# Patient Record
Sex: Female | Born: 1970 | Race: White | Hispanic: No | Smoking: Never smoker
Health system: Southern US, Community
[De-identification: ages and names within clinical notes are randomized; demographics above are authoritative.]

## PROBLEM LIST (undated history)

## (undated) DIAGNOSIS — J45909 Unspecified asthma, uncomplicated: Secondary | ICD-10-CM

---

## 1999-10-15 ENCOUNTER — Encounter: Payer: Self-pay | Admitting: Obstetrics and Gynecology

## 1999-10-15 ENCOUNTER — Ambulatory Visit (HOSPITAL_COMMUNITY): Admission: RE | Admit: 1999-10-15 | Discharge: 1999-10-15 | Payer: Self-pay | Admitting: Obstetrics and Gynecology

## 1999-11-05 ENCOUNTER — Ambulatory Visit (HOSPITAL_COMMUNITY): Admission: RE | Admit: 1999-11-05 | Discharge: 1999-11-05 | Payer: Self-pay | Admitting: Obstetrics and Gynecology

## 1999-11-05 ENCOUNTER — Encounter: Payer: Self-pay | Admitting: Obstetrics and Gynecology

## 1999-12-24 ENCOUNTER — Ambulatory Visit (HOSPITAL_COMMUNITY): Admission: RE | Admit: 1999-12-24 | Discharge: 1999-12-24 | Payer: Self-pay | Admitting: Obstetrics and Gynecology

## 2000-03-10 ENCOUNTER — Inpatient Hospital Stay (HOSPITAL_COMMUNITY): Admission: AD | Admit: 2000-03-10 | Discharge: 2000-03-12 | Payer: Self-pay | Admitting: Obstetrics and Gynecology

## 2000-05-26 ENCOUNTER — Other Ambulatory Visit: Admission: RE | Admit: 2000-05-26 | Discharge: 2000-05-26 | Payer: Self-pay | Admitting: Obstetrics and Gynecology

## 2002-05-29 ENCOUNTER — Other Ambulatory Visit: Admission: RE | Admit: 2002-05-29 | Discharge: 2002-05-29 | Payer: Self-pay | Admitting: Obstetrics and Gynecology

## 2003-07-10 ENCOUNTER — Other Ambulatory Visit: Admission: RE | Admit: 2003-07-10 | Discharge: 2003-07-10 | Payer: Self-pay | Admitting: Obstetrics and Gynecology

## 2004-07-29 ENCOUNTER — Other Ambulatory Visit: Admission: RE | Admit: 2004-07-29 | Discharge: 2004-07-29 | Payer: Self-pay | Admitting: Obstetrics and Gynecology

## 2005-08-31 ENCOUNTER — Other Ambulatory Visit: Admission: RE | Admit: 2005-08-31 | Discharge: 2005-08-31 | Payer: Self-pay | Admitting: Obstetrics and Gynecology

## 2006-03-16 ENCOUNTER — Encounter: Admission: RE | Admit: 2006-03-16 | Discharge: 2006-03-16 | Payer: Self-pay | Admitting: Obstetrics and Gynecology

## 2006-03-30 ENCOUNTER — Encounter: Admission: RE | Admit: 2006-03-30 | Discharge: 2006-03-30 | Payer: Self-pay | Admitting: Obstetrics and Gynecology

## 2006-10-21 ENCOUNTER — Encounter: Admission: RE | Admit: 2006-10-21 | Discharge: 2006-10-21 | Payer: Self-pay | Admitting: Unknown Physician Specialty

## 2010-03-27 ENCOUNTER — Encounter: Admission: RE | Admit: 2010-03-27 | Discharge: 2010-03-27 | Payer: Self-pay | Admitting: Obstetrics and Gynecology

## 2010-09-11 NOTE — Discharge Summary (Signed)
[facility]  Patient:    Sarah Patrick, Sarah Patrick Visit Number: [id number] MRN: [id number]          Service Type: OBS Location: 910A [id number] [id number] Attending Physician:  Oliver Pila Admit Date:  03/10/2000 Disc. Date: 03/12/00                             Discharge Summary  HISTORY OF PRESENT ILLNESS:   A 40 year old white female P0-1-0-1, gravida 2, with EDC 03/17/00 by first trimester ultrasound inconsistent with last period, presented for induction of labor given size greater than dates and suspected large-for-gestational-age fetus and favorable cervix. Prenatal care was uncomplicated except Rh negative, got RhoGAM at 28 weeks. Prenatal labs: A negative with negative antibody, RPR nonreactive, rubella immune, hepatitis B surface antigen negative, HIV negative, GC and Chlamydia negative, Group B strep negative.  PAST OBSTETRIC HISTORY:       The patient had a 7 pound 7 ounce infant in 1998 vaginally.  PAST GYNECOLOGIC HISTORY:     No abnormal Paps.  PAST MEDICAL HISTORY:         Negative.  SURGICAL HISTORY:             1990, broken left leg.  ALLERGIES:                    No known allergies.  MEDICATIONS:                  Prenatal vitamins.  PHYSICAL EXAMINATION:         Physical exam on admission revealed afebrile patient with normal vital signs, reactive nonstress test. Heart normal size and sounds. Lungs clear. Abdomen gravid. Estimated fetal weight 8 to 8.5 pounds. Cervix 3 cm, 70%, vertex, -3.  HOSPITAL COURSE:              The patient was placed on Pitocin and it went up to 28 milliunits a minute with no contractions. The bag was changed. The patient began having contractions. Cervix reached 4 cm at 5:30 p.m. Dr. Senaida Ores attempted to "needle" the membranes with a few drops of fluid and mucus obtained. Because the station was high a full rupture of membranes was not done. By 8 p.m. the cervix was 9 cm and the patient had an  epidural. She progressed to complete and pushed well with a normal spontaneous vaginal delivery of a vigorous female infant over a first-degree perineal laceration, Apgars of 7 and 9 at one and five minutes. Infant weighed 8 pounds 5 ounces. Nuchal cord x 1 was reduced over the head. There was terminal meconium. Placenta delivered spontaneously. Three-vessel cord. Blood loss about 400 cc. First-degree perineal laceration repaired with 2-0 Vicryl for hemostasis. Cervix and rectum intact. Mild shoulder dystocia relieved by suprapubic pressure and McRoberts maneuver without difficulty. Postpartum the patient did quite well and was discharged on the second postpartum day. The baby was Rh negative. The patient was not a candidate for RhoGAM. Hemoglobin was 12.6, hematocrit 34.3, white count 10,100, platelet count 175,000.  FINAL DIAGNOSIS:              Intrauterine pregnancy at 39 weeks, delivered vertex vaginally.  OPERATION:                    Spontaneous vaginal delivery, repair of first-degree perineal laceration.  FINAL CONDITION:  Improved.  DISCHARGE INSTRUCTIONS:       Instructions include our regular discharge instruction booklet. Patient is given a prescription for ibuprofen 600 mg, 20 tablets, 1 every 8 hours as needed for pain, with no refills. She is to return in six weeks for follow-up examination. Attending Physician:  Oliver Pila DD:  03/12/00 TD:  03/12/00 Job: [id number]

## 2011-03-03 ENCOUNTER — Other Ambulatory Visit: Payer: Self-pay | Admitting: Obstetrics and Gynecology

## 2011-03-03 DIAGNOSIS — Z1231 Encounter for screening mammogram for malignant neoplasm of breast: Secondary | ICD-10-CM

## 2011-04-01 ENCOUNTER — Ambulatory Visit: Payer: Self-pay

## 2011-04-13 ENCOUNTER — Ambulatory Visit
Admission: RE | Admit: 2011-04-13 | Discharge: 2011-04-13 | Disposition: A | Discharge status: Home / Self Care | Payer: BC Managed Care – PPO | Source: Ambulatory Visit | Attending: Obstetrics and Gynecology | Admitting: Obstetrics and Gynecology

## 2011-04-13 DIAGNOSIS — Z1231 Encounter for screening mammogram for malignant neoplasm of breast: Secondary | ICD-10-CM

## 2012-03-08 ENCOUNTER — Other Ambulatory Visit: Payer: Self-pay | Admitting: Obstetrics & Gynecology

## 2012-03-08 DIAGNOSIS — Z1231 Encounter for screening mammogram for malignant neoplasm of breast: Secondary | ICD-10-CM

## 2012-04-25 ENCOUNTER — Ambulatory Visit
Admission: RE | Admit: 2012-04-25 | Discharge: 2012-04-25 | Disposition: A | Discharge status: Home / Self Care | Payer: BC Managed Care – PPO | Source: Ambulatory Visit | Attending: Obstetrics & Gynecology | Admitting: Obstetrics & Gynecology

## 2012-04-25 DIAGNOSIS — Z1231 Encounter for screening mammogram for malignant neoplasm of breast: Secondary | ICD-10-CM

## 2012-05-02 ENCOUNTER — Other Ambulatory Visit: Payer: Self-pay | Admitting: Obstetrics & Gynecology

## 2012-05-02 DIAGNOSIS — R928 Other abnormal and inconclusive findings on diagnostic imaging of breast: Secondary | ICD-10-CM

## 2012-05-09 ENCOUNTER — Ambulatory Visit
Admission: RE | Admit: 2012-05-09 | Discharge: 2012-05-09 | Disposition: A | Discharge status: Home / Self Care | Payer: BC Managed Care – PPO | Source: Ambulatory Visit | Attending: Obstetrics & Gynecology | Admitting: Obstetrics & Gynecology

## 2012-05-09 DIAGNOSIS — R928 Other abnormal and inconclusive findings on diagnostic imaging of breast: Secondary | ICD-10-CM

## 2012-05-09 IMAGING — MG MM DIGITAL DIAGNOSTIC LIMITED*L*
3 series · 3 of 3 positions shown · non-contrast
Comparison: Previous examinations, including the screening
mammogram dated 04/25/2012.

CLINICAL DATA: Possible left breast mass at recent screening
mammography.

DIGITAL DIAGNOSTIC LEFT MAMMOGRAM  AND LEFT BREAST ULTRASOUND:

[L CC]
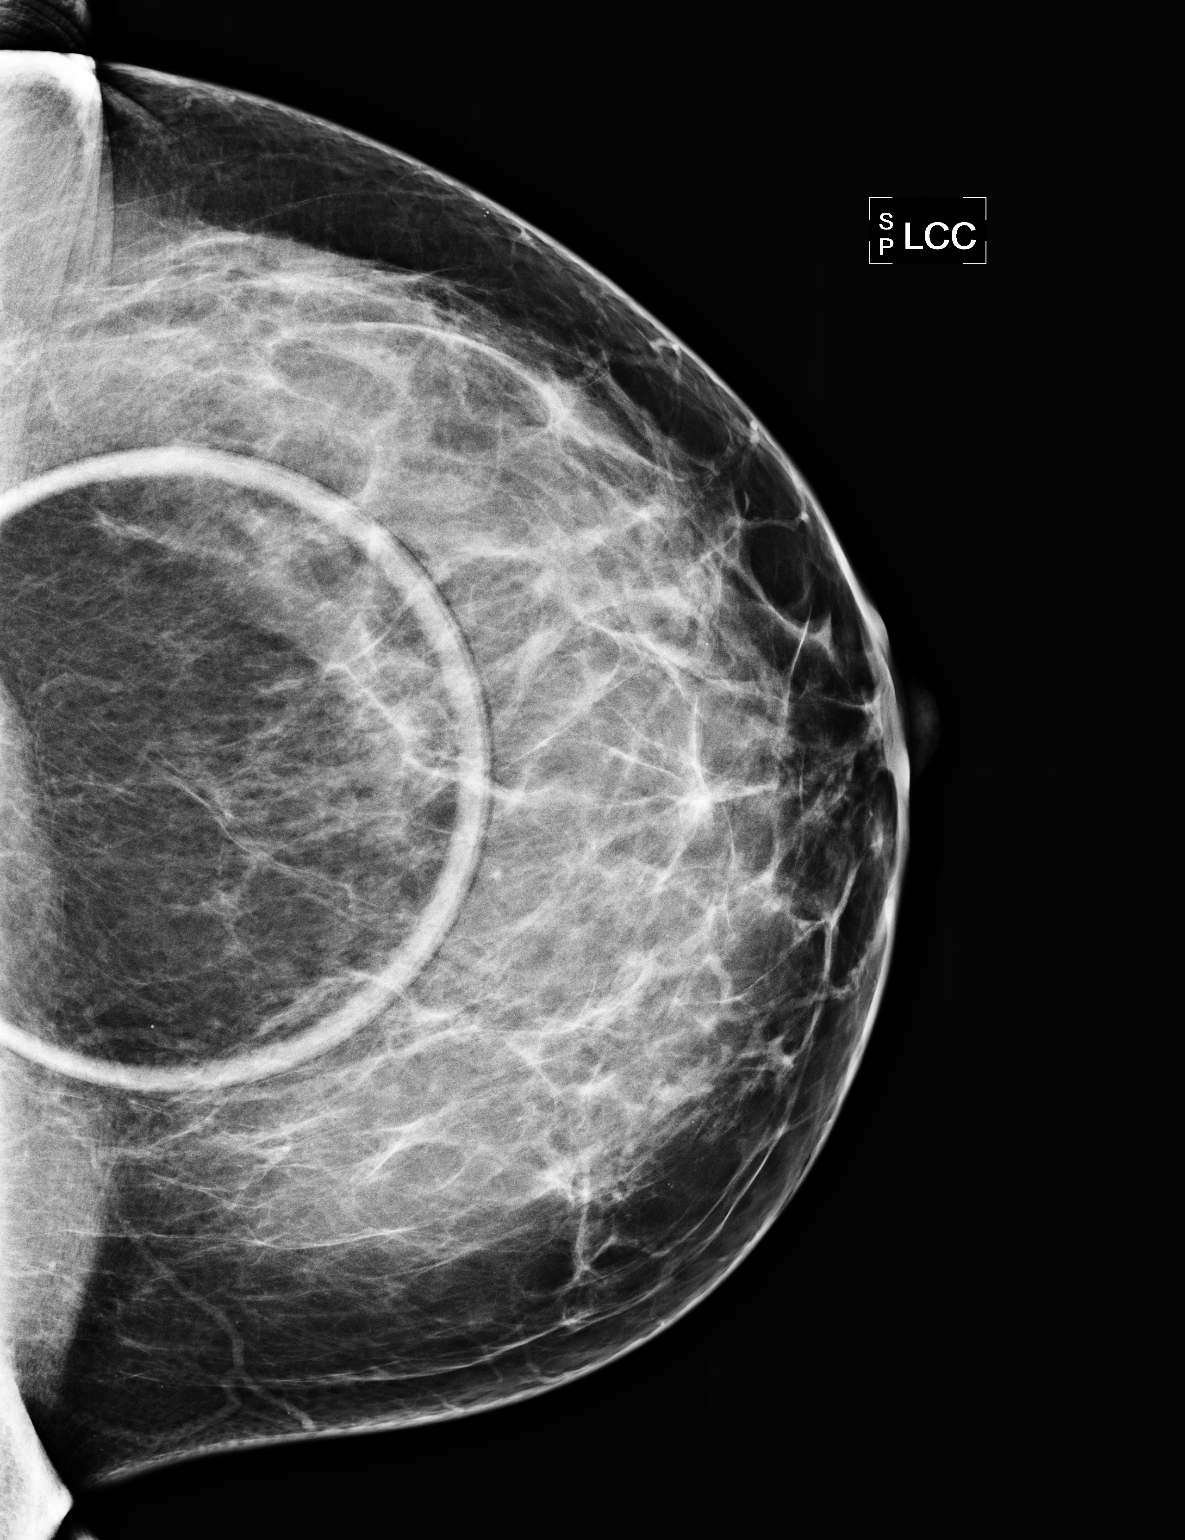

[L MLO (1 of 2)]
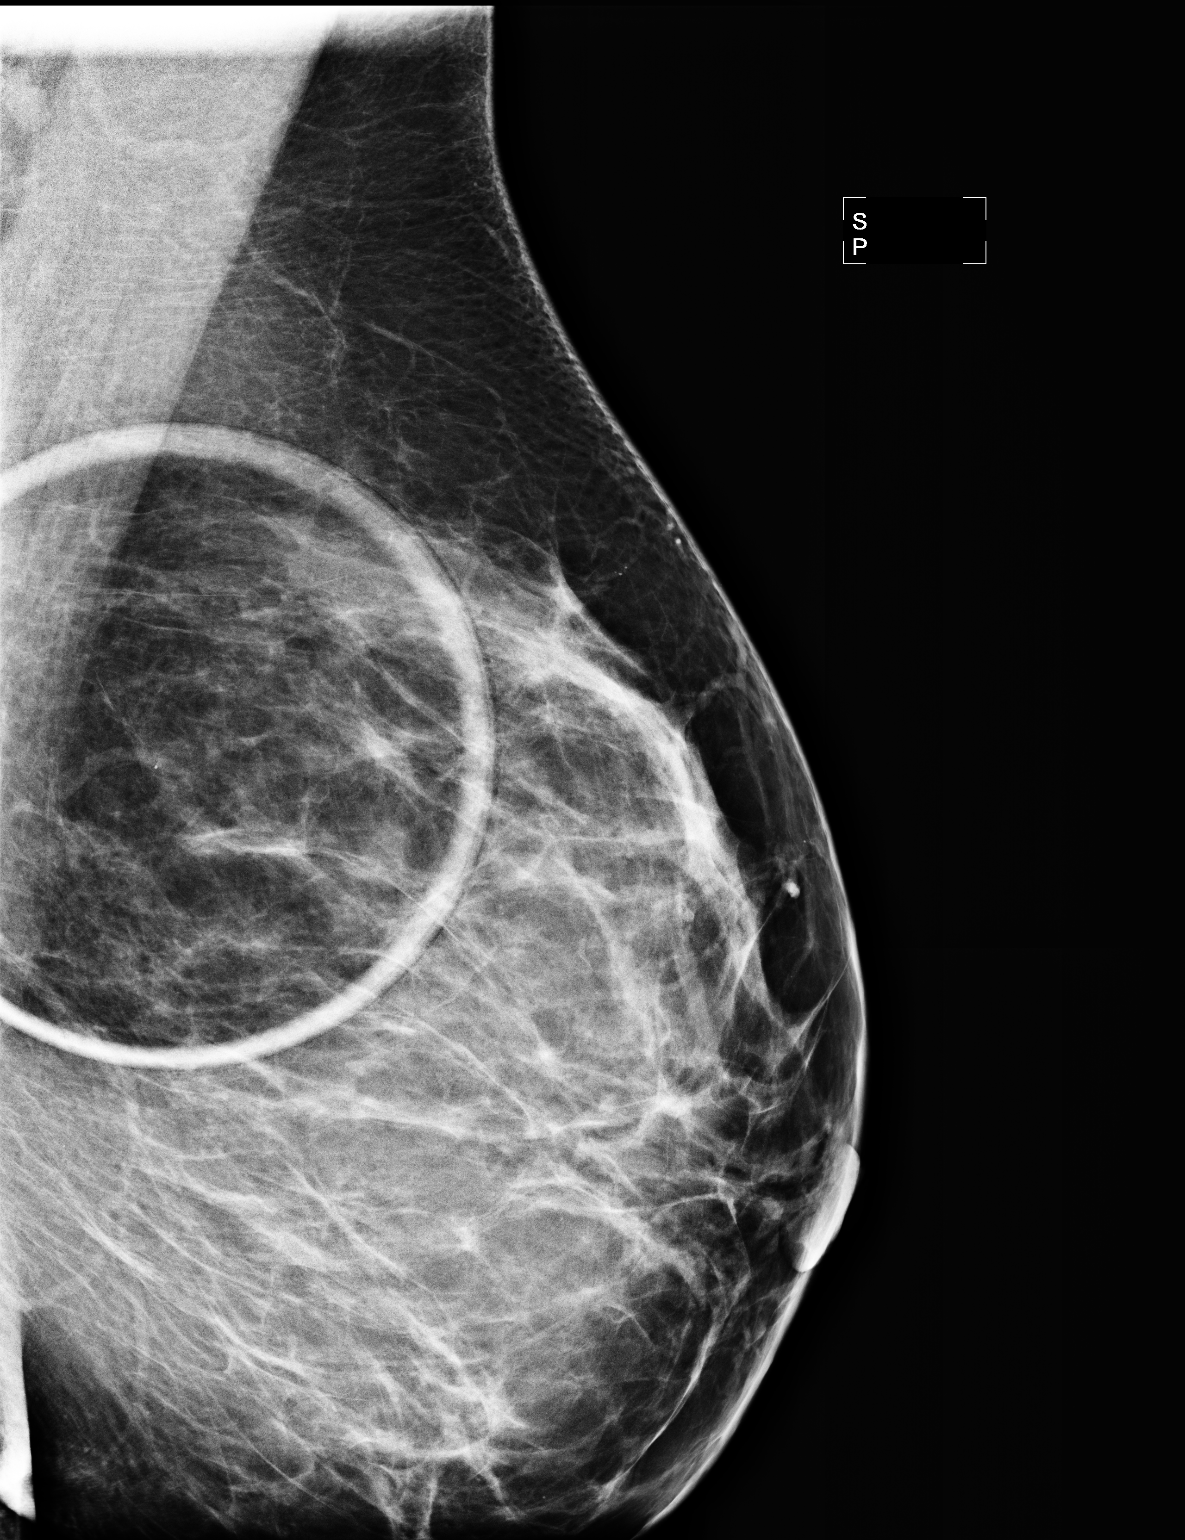

[L MLO (2 of 2)]
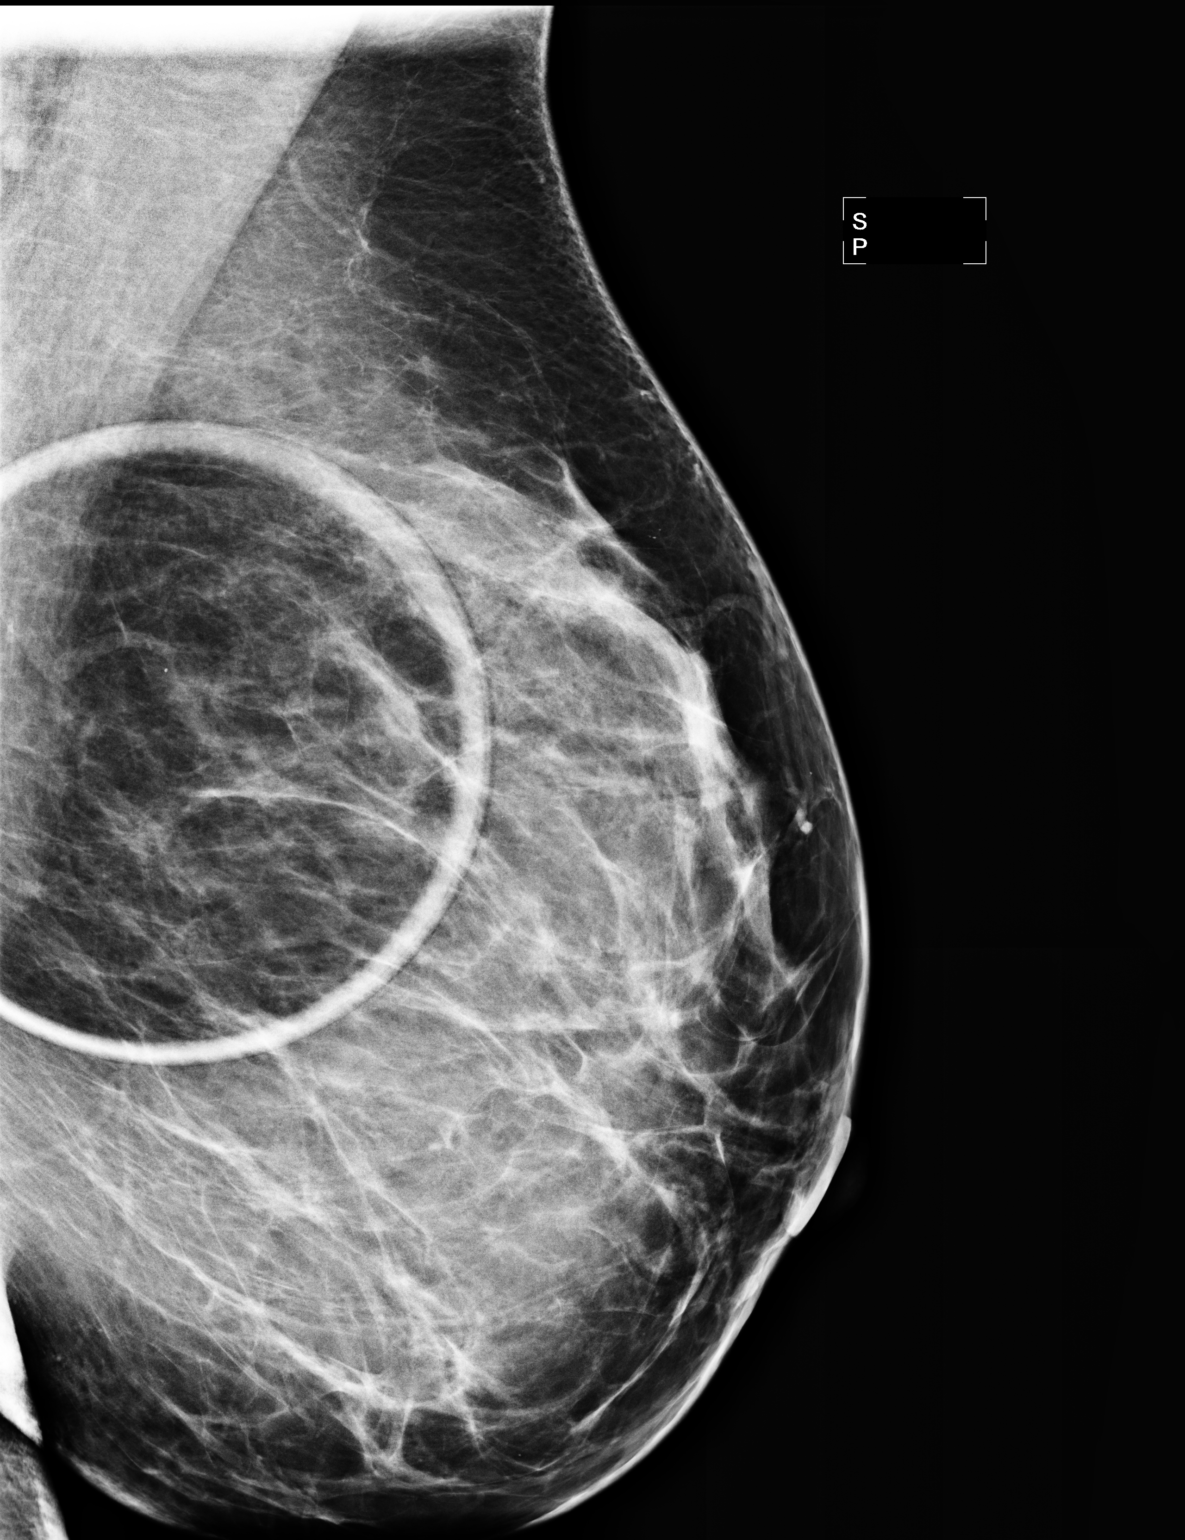

[3 of 3 positions shown; findings below may reference images not displayed]

FINDINGS: ACR Breast Density Category 3: The breast tissue is heterogeneously
dense.

Spot compression views of the left breast demonstrate normal
appearing fibroglandular tissue at the location of the recently
suspected mass.

On physical exam, no mass is palpable in the 12 o'clock position of
the left breast.

Ultrasound is performed, showing normal appearing breast tissue
throughout the 12 o'clock region of the left breast.
IMPRESSION: No evidence of malignancy.  The recently suspected left breast mass
represented overlapping normal tissue.

RECOMMENDATION:
Bilateral screening mammogram in 1 year.

I have discussed the findings and recommendations with the patient.
Results were also provided in writing at the conclusion of the
visit.

BI-RADS CATEGORY 1:  Negative.

## 2012-07-03 ENCOUNTER — Encounter (HOSPITAL_COMMUNITY): Payer: Self-pay | Admitting: Emergency Medicine

## 2012-07-03 ENCOUNTER — Emergency Department (HOSPITAL_COMMUNITY)
Admission: EM | Admit: 2012-07-03 | Discharge: 2012-07-03 | Disposition: A | Discharge status: Home / Self Care | Payer: BC Managed Care – PPO | Source: Home / Self Care

## 2012-07-03 DIAGNOSIS — R0789 Other chest pain: Secondary | ICD-10-CM

## 2012-07-03 HISTORY — DX: Unspecified asthma, uncomplicated: J45.909

## 2012-07-03 NOTE — ED Provider Notes (Signed)
History     CSN: [id number]  Arrival date & time 07/03/12  1839   First MD Initiated Contact with Patient 07/03/12 1942      Chief Complaint  Patient presents with  . Chest Pain    hx of asthma. nasal stuffiness cough and wheezing took asthma meds caused chest pain. tightness and burning sensation.     (Consider location/radiation/quality/duration/timing/severity/associated sxs/prior treatment) Patient is a 42 y.o. female presenting with chest pain.  Chest Pain Associated symptoms: cough    This is a 42 year old female who has been dealing with seasonal allergies for a little over 2 weeks now. She saw an allergy specialist and was prescribed Allegra, Flonase & Pro air. She developed a cough today followed by wheezing and therefore used her Pro air after which she noticed a severe burning and tightness in the upper part of her chest. Her shortness of breath and cough however improved after using the inhaler. Chest pain was not associated with any palpitations diaphoresis or nausea. It did not radiate to her jaw or arm. It was moderate in severity. Because it did not resolve she presented to urgent care. While waiting to be seen the pain resolved on its own. She has not had any pain like this at rest or with exertion in the past.    Past Medical History  Diagnosis Date  . Asthma     History reviewed. No pertinent past surgical history.  History reviewed. No pertinent family history.  History  Substance Use Topics  . Smoking status: Never Smoker   . Smokeless tobacco: Not on file  . Alcohol Use: Yes    OB History   Grav Para Term Preterm Abortions TAB SAB Ect Mult Living                  Review of Systems  Constitutional: Negative.   Eyes: Negative.   Respiratory: Positive for cough.   Cardiovascular: Positive for chest pain.  Gastrointestinal: Negative.   Genitourinary: Negative.   Musculoskeletal: Negative.   Skin: Negative.   Neurological: Negative.    Hematological: Negative.   Psychiatric/Behavioral: Negative.     Allergies  Penicillins  Home Medications  No current outpatient prescriptions on file.  BP 128/93  Pulse 78  Temp(Src) 97.7 F (36.5 C) (Oral)  Resp 16  SpO2 99%  Physical Exam  Constitutional: She is oriented to person, place, and time. She appears well-developed and well-nourished.  HENT:  Head: Normocephalic and atraumatic.  Eyes: Conjunctivae and EOM are normal. Pupils are equal, round, and reactive to light.  Neck: Normal range of motion. Neck supple.  Cardiovascular: Normal rate and regular rhythm.   Pulmonary/Chest: Effort normal and breath sounds normal. She exhibits no tenderness.  Abdominal: Soft. Bowel sounds are normal.  Musculoskeletal: Normal range of motion.  Neurological: She is alert and oriented to person, place, and time.  Skin: Skin is warm and dry.  Psychiatric: She has a normal mood and affect. Her behavior is normal.    ED Course  Procedures (including critical care time)  Labs Reviewed - No data to display No results found.   1. Chest pain, atypical       MDM  EKG reveals nonspecific T wave flattening inversion in lead 3 only. At this point I suspect that pain was related to irritation of tracheobronchial tree from Pro air and current respiratory issues.. She is advised that if pain recurs she will need to go to the emergency room for further workup.  Calvert Cantor, MD 07/03/12 2035

## 2012-07-03 NOTE — ED Notes (Signed)
Pt c/o nasal congestion and with cough. Some wheezing. Pt states that she took asthma medication which caused burning sensation/chest soreness and tightness. But did relieve symptoms of sob.  Symptoms started 45 minutes ago.

## 2012-07-03 NOTE — ED Notes (Signed)
Called by reg assoc to evaluate this pt. Pt c/o cough, chest tightness, wheezing, sneezing x 1 hour. Pt reports using Pro-Air inhaler w/ "some" relief. Pt speaks in multiple word sentences w/o taking a breath in between words in the same sentence. Pt skin warm & dry, denies difficulty breathing. States her "allergies have been worse for the past few weeks." Pt instructed to remain in the waiting area & to inform staff if anything changed in her current condition/complaint prior to being called to the treatment/exam room. Pt verbalized her understanding of this instruction.

## 2015-05-02 ENCOUNTER — Ambulatory Visit: Payer: Self-pay | Admitting: Podiatry

## 2015-05-08 ENCOUNTER — Encounter: Payer: Self-pay | Admitting: Podiatry

## 2015-05-08 ENCOUNTER — Ambulatory Visit (INDEPENDENT_AMBULATORY_CARE_PROVIDER_SITE_OTHER): Payer: BLUE CROSS/BLUE SHIELD | Admitting: Podiatry

## 2015-05-08 VITALS — BP 124/90 | HR 71 | Resp 16 | Ht 64.0 in | Wt 195.0 lb

## 2015-05-08 DIAGNOSIS — L03012 Cellulitis of left finger: Secondary | ICD-10-CM

## 2015-05-08 DIAGNOSIS — L600 Ingrowing nail: Secondary | ICD-10-CM | POA: Diagnosis not present

## 2015-05-08 DIAGNOSIS — L03032 Cellulitis of left toe: Secondary | ICD-10-CM

## 2015-05-08 NOTE — Progress Notes (Signed)
   Subjective:    Patient ID: Sarah Patrick, female    DOB: Oct 10, 1970, 45 y.o.   MRN: [id number]  HPI Patient presents with a nail problem in their left foot; great toe-lateral; Pt has used peroxide & neosporin with some relief. This has been going on for the past month.   Review of Systems  All other systems reviewed and are negative.      Objective:   Physical Exam        Assessment & Plan:

## 2015-05-08 NOTE — Patient Instructions (Signed)

## 2015-05-11 NOTE — Progress Notes (Signed)
Subjective:     Patient ID: Sarah Patrick, female   DOB: July 22, 1970, 45 y.o.   MRN: [id number]  HPI patient presents with significant redness in the lateral border the left big toe with localized drainage thickness of tissue and pain of one-month duration   Review of Systems  All other systems reviewed and are negative.      Objective:   Physical Exam  Constitutional: She is oriented to person, place, and time.  Cardiovascular: Intact distal pulses.   Musculoskeletal: Normal range of motion.  Neurological: She is oriented to person, place, and time.  Skin: Skin is warm.  Nursing note and vitals reviewed.  neurovascular status intact muscle strength adequate range of motion within normal limits with patient noted to have erythema edema and enlargement of tissue around the left hallux lateral border with localized drainage and no proximal edema erythema or drainage noted. 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 This will be done in a two-part processes and today I infiltrated 60 mg like Marcaine mixture remove the lateral border removed all proud flesh abscess tissue and created channel for drainage and begin soaks and reappoint 2 weeks for permanent procedure or earlier if needed

## 2015-05-16 ENCOUNTER — Telehealth: Payer: Self-pay | Admitting: *Deleted

## 2015-05-16 NOTE — Telephone Encounter (Signed)
Called patient at [phone] (Cell #) to check to see how they were doing from their paronychia procedure that was performed on Thursday, May 08, 2015. Pt stated, "Doing much better".

## 2015-05-22 ENCOUNTER — Ambulatory Visit: Payer: BLUE CROSS/BLUE SHIELD | Admitting: Podiatry

## 2015-05-28 ENCOUNTER — Encounter: Payer: BLUE CROSS/BLUE SHIELD | Admitting: Podiatry

## 2015-05-28 NOTE — Patient Instructions (Signed)

## 2015-07-25 NOTE — Progress Notes (Signed)
This encounter was created in error - please disregard.

## 2018-12-19 ENCOUNTER — Other Ambulatory Visit: Payer: Self-pay

## 2018-12-19 DIAGNOSIS — Z20822 Contact with and (suspected) exposure to covid-19: Secondary | ICD-10-CM

## 2018-12-21 LAB — NOVEL CORONAVIRUS, NAA: SARS-CoV-2, NAA: NOT DETECTED

## 2019-03-09 ENCOUNTER — Other Ambulatory Visit: Payer: Self-pay

## 2019-03-09 DIAGNOSIS — Z20822 Contact with and (suspected) exposure to covid-19: Secondary | ICD-10-CM

## 2019-03-12 LAB — NOVEL CORONAVIRUS, NAA: SARS-CoV-2, NAA: NOT DETECTED

## 2019-12-04 ENCOUNTER — Ambulatory Visit: Payer: BC Managed Care – PPO

## 2019-12-04 ENCOUNTER — Ambulatory Visit: Payer: Self-pay | Attending: Internal Medicine

## 2019-12-04 DIAGNOSIS — Z23 Encounter for immunization: Secondary | ICD-10-CM

## 2019-12-04 NOTE — Progress Notes (Signed)
   Covid-19 Vaccination Clinic  Name:  KATHLEE BARNHARDT    MRN: [id number] DOB: 1970/10/24  12/04/2019  Ms. Holsonback was observed post Covid-19 immunization for 15 minutes without incident. She was provided with Vaccine Information Sheet and instruction to access the V-Safe system.   Ms. Magill was instructed to call 911 with any severe reactions post vaccine: Marland Kitchen Difficulty breathing  . Swelling of face and throat  . A fast heartbeat  . A bad rash all over body  . Dizziness and weakness   Immunizations Administered    Name Date Dose VIS Date Route   Moderna COVID-19 Vaccine 12/04/2019  9:08 AM 0.5 mL 03/2019 Intramuscular   Manufacturer: Moderna   Lot: [id number]   NDC: 38756-433-29

## 2020-01-01 ENCOUNTER — Ambulatory Visit: Payer: BC Managed Care – PPO | Attending: Internal Medicine

## 2020-01-01 DIAGNOSIS — Z23 Encounter for immunization: Secondary | ICD-10-CM

## 2020-01-01 NOTE — Progress Notes (Signed)
   Covid-19 Vaccination Clinic  Name:  MOHOGANY TOPPINS    MRN: [id number] DOB: [birth year]  01/01/2020  Ms. Wisby was observed post Covid-19 immunization for 15 minutes without incident. She was provided with Vaccine Information Sheet and instruction to access the V-Safe system.   Ms. Trotti was instructed to call 911 with any severe reactions post vaccine: Marland Kitchen Difficulty breathing  . Swelling of face and throat  . A fast heartbeat  . A bad rash all over body  . Dizziness and weakness   Immunizations Administered    Name Date Dose VIS Date Route   Moderna COVID-19 Vaccine 01/01/2020  4:29 PM 0.5 mL 03/2019 Intramuscular   Manufacturer: Moderna   Lot: [id number]   NDC: 74081-448-18

## 2020-01-11 ENCOUNTER — Other Ambulatory Visit: Payer: Self-pay

## 2024-01-25 ENCOUNTER — Other Ambulatory Visit: Payer: Self-pay | Admitting: Medical Genetics
